# Patient Record
Sex: Female | Born: 1935 | Race: White | Hispanic: No | Smoking: Never smoker
Health system: Southern US, Community
[De-identification: ages and names within clinical notes are randomized; demographics above are authoritative.]

## PROBLEM LIST (undated history)

## (undated) DIAGNOSIS — C801 Malignant (primary) neoplasm, unspecified: Secondary | ICD-10-CM

---

## 2004-11-14 ENCOUNTER — Ambulatory Visit: Payer: Self-pay | Admitting: Internal Medicine

## 2005-06-02 ENCOUNTER — Ambulatory Visit: Payer: Self-pay | Admitting: Unknown Physician Specialty

## 2018-11-26 ENCOUNTER — Other Ambulatory Visit: Payer: Self-pay | Admitting: Internal Medicine

## 2018-11-26 DIAGNOSIS — Z1231 Encounter for screening mammogram for malignant neoplasm of breast: Secondary | ICD-10-CM

## 2018-12-10 ENCOUNTER — Other Ambulatory Visit: Payer: Self-pay | Admitting: *Deleted

## 2018-12-10 ENCOUNTER — Inpatient Hospital Stay
Admission: RE | Admit: 2018-12-10 | Discharge: 2018-12-10 | Disposition: A | Discharge status: Home / Self Care | Payer: Self-pay | Source: Ambulatory Visit | Attending: *Deleted | Admitting: *Deleted

## 2018-12-10 DIAGNOSIS — Z9289 Personal history of other medical treatment: Secondary | ICD-10-CM

## 2019-01-27 ENCOUNTER — Ambulatory Visit
Admission: RE | Admit: 2019-01-27 | Discharge: 2019-01-27 | Disposition: A | Discharge status: Home / Self Care | Payer: Medicare Other | Source: Ambulatory Visit | Attending: Internal Medicine | Admitting: Internal Medicine

## 2019-01-27 ENCOUNTER — Encounter (HOSPITAL_COMMUNITY): Payer: Self-pay

## 2019-01-27 DIAGNOSIS — Z1231 Encounter for screening mammogram for malignant neoplasm of breast: Secondary | ICD-10-CM | POA: Diagnosis present

## 2019-01-27 HISTORY — DX: Malignant (primary) neoplasm, unspecified: C80.1

## 2019-05-15 DIAGNOSIS — C44329 Squamous cell carcinoma of skin of other parts of face: Secondary | ICD-10-CM

## 2019-05-15 DIAGNOSIS — D099 Carcinoma in situ, unspecified: Secondary | ICD-10-CM

## 2019-05-15 HISTORY — DX: Squamous cell carcinoma of skin of other parts of face: C44.329

## 2019-05-15 HISTORY — DX: Carcinoma in situ, unspecified: D09.9

## 2019-10-20 IMAGING — MG DIGITAL SCREENING BILATERAL MAMMOGRAM WITH TOMO AND CAD
8 series · 9 of 24 positions shown · non-contrast
Comparison: Previous exam(s).

CLINICAL DATA: Screening.

EXAM:
DIGITAL SCREENING BILATERAL MAMMOGRAM WITH TOMO AND CAD

[L MLO synth-2D]
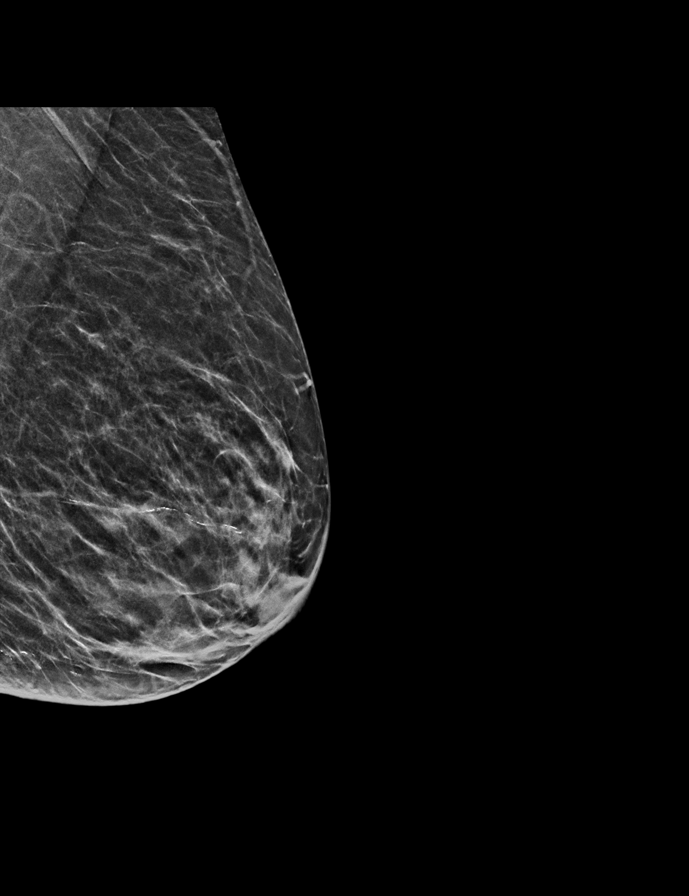

[R MLO synth-2D]
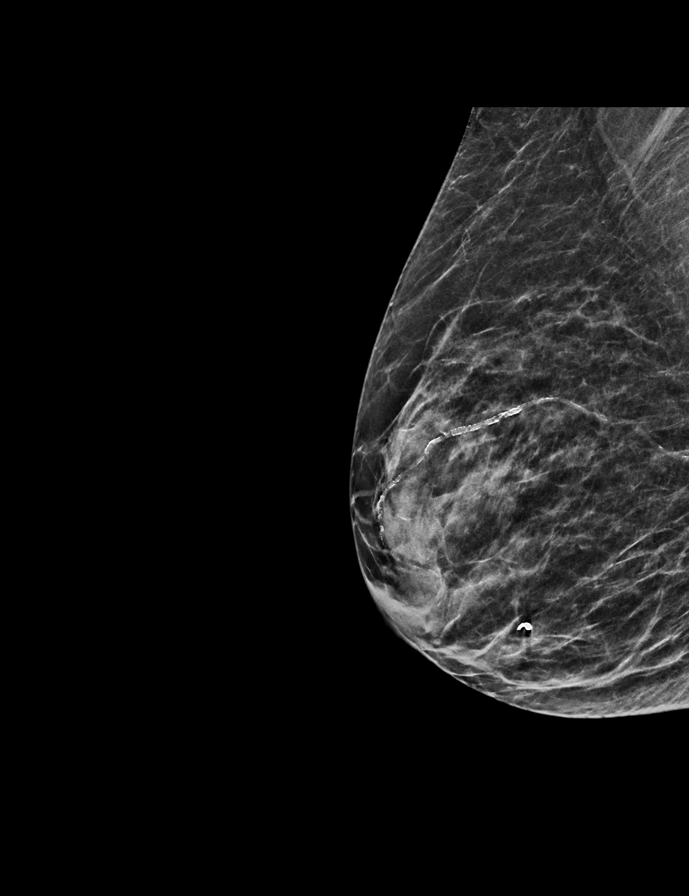

[L CC synth-2D]
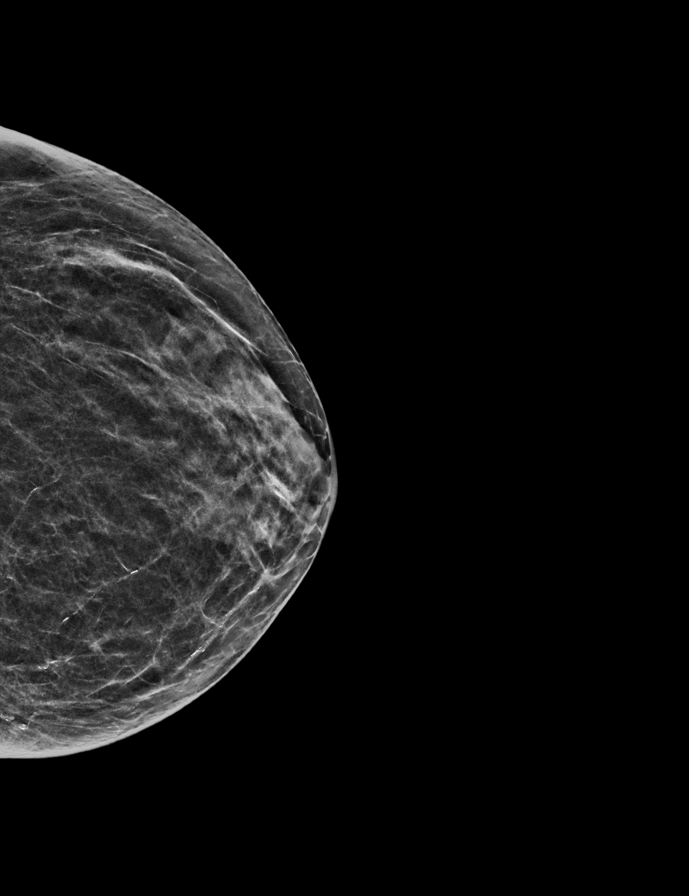

[R CC synth-2D]
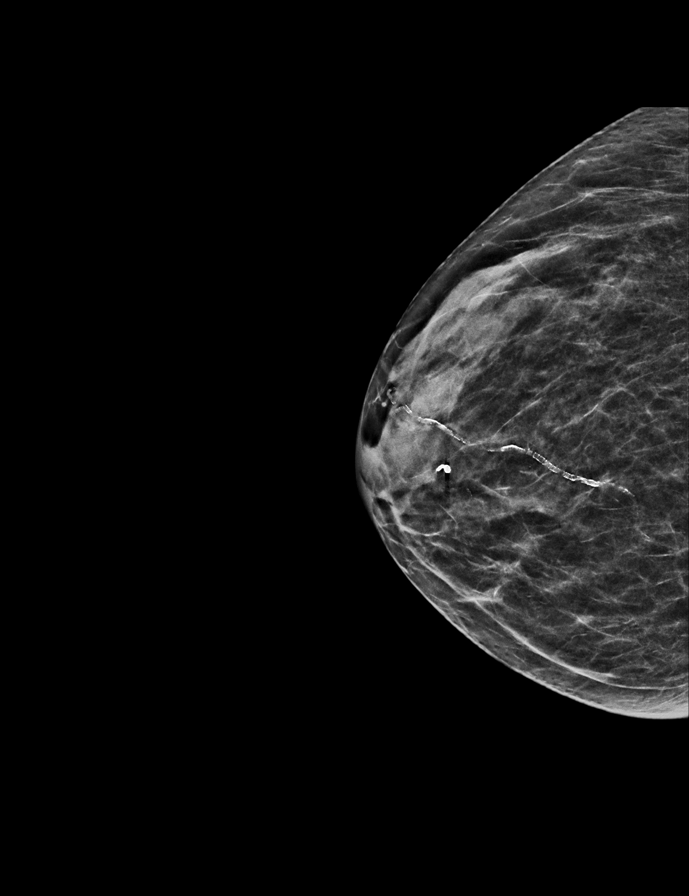

[L MLO tomo · 2 of 46 frames shown]
[frame 15/46]
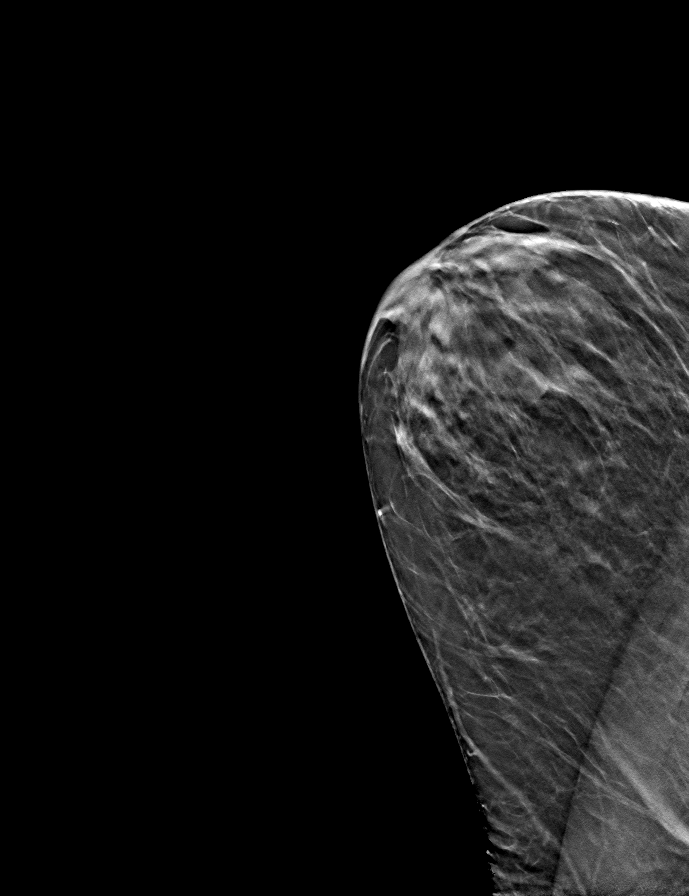
[frame 23/46]
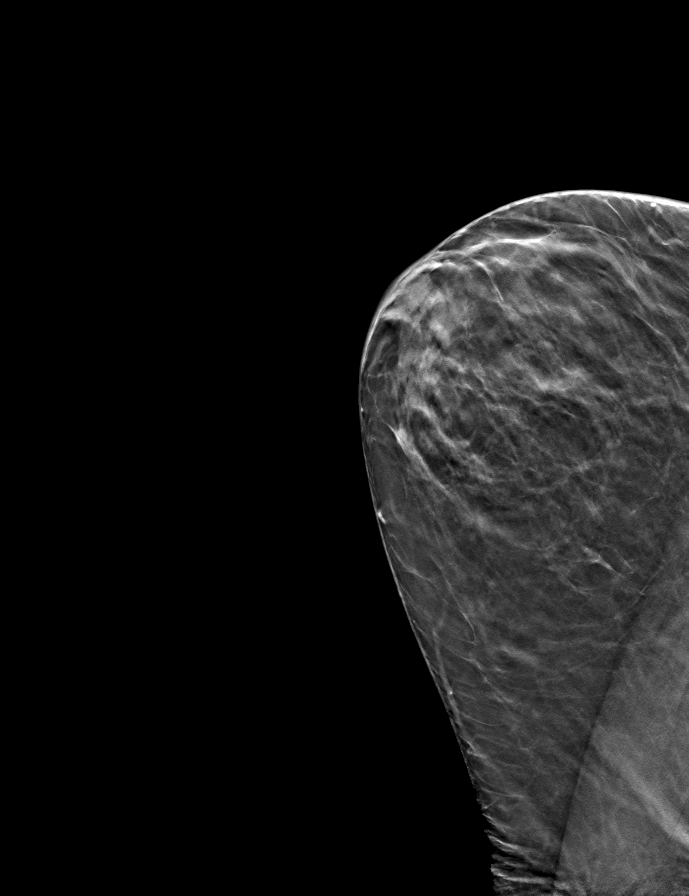

[L CC tomo · tomo slice 24/47.0]
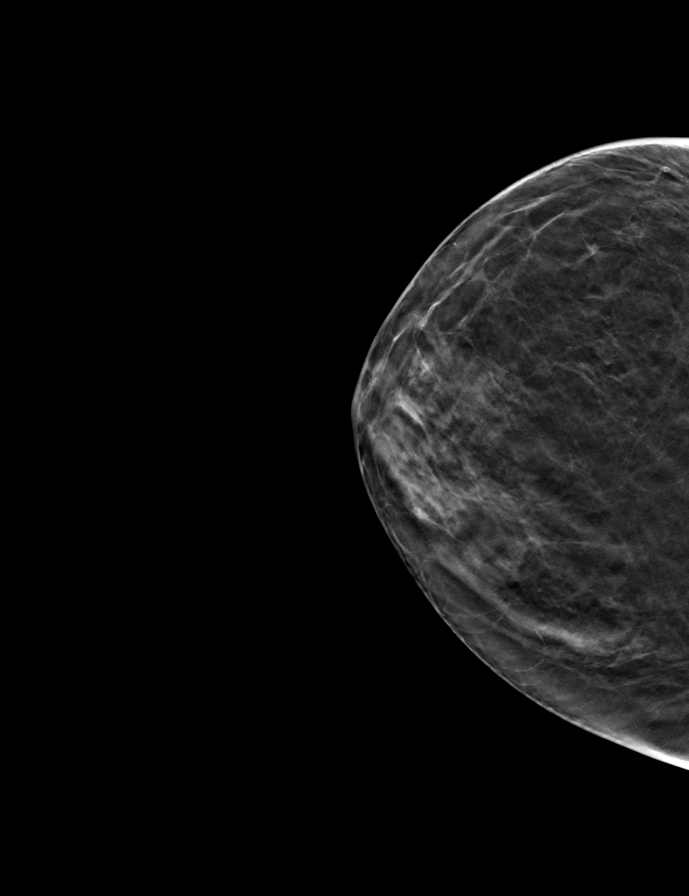

[R CC tomo · tomo slice 23/45.0]
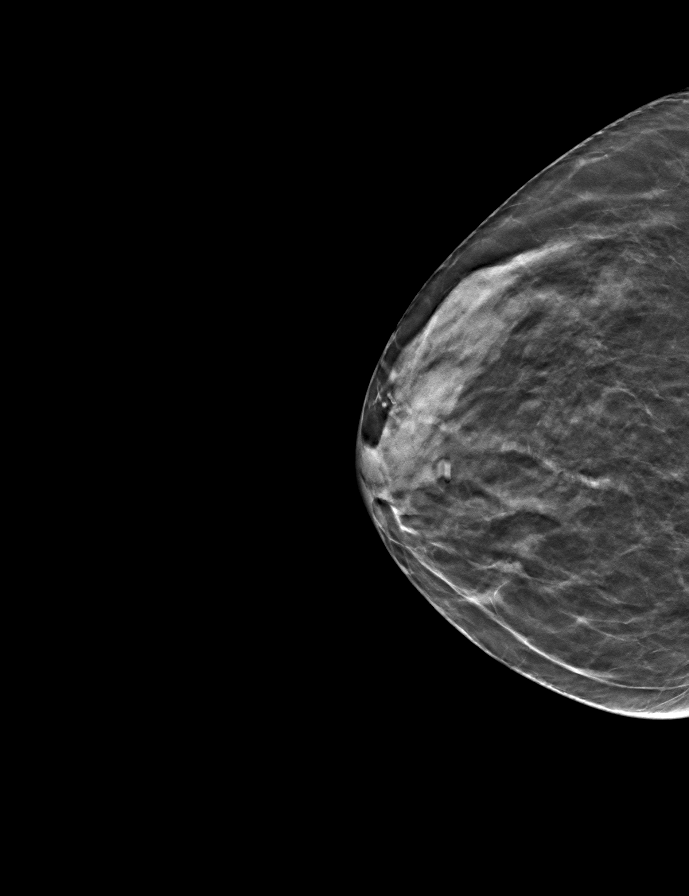

[R MLO tomo · tomo slice 23/46.0]
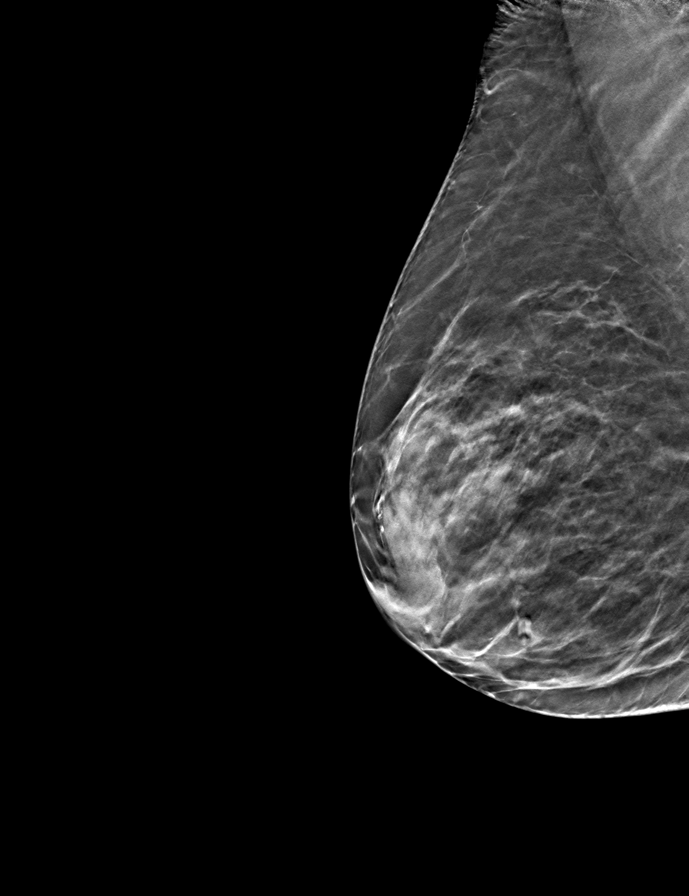

[9 of 24 positions shown; findings below may reference images not displayed]

ACR Breast Density Category c: The breast tissue is heterogeneously
dense, which may obscure small masses.
FINDINGS: There are no findings suspicious for malignancy. Images were
processed with CAD.
IMPRESSION: No mammographic evidence of malignancy. A result letter of this
screening mammogram will be mailed directly to the patient.

RECOMMENDATION:
Screening mammogram in one year. (Code:FT-U-LHB)

BI-RADS CATEGORY  1: Negative.

## 2019-11-13 DIAGNOSIS — C44319 Basal cell carcinoma of skin of other parts of face: Secondary | ICD-10-CM

## 2019-11-13 HISTORY — DX: Basal cell carcinoma of skin of other parts of face: C44.319

## 2020-01-05 ENCOUNTER — Ambulatory Visit: Payer: Medicare Other

## 2020-02-11 ENCOUNTER — Encounter: Payer: Self-pay | Admitting: Dermatology

## 2020-02-11 ENCOUNTER — Ambulatory Visit (INDEPENDENT_AMBULATORY_CARE_PROVIDER_SITE_OTHER): Payer: Medicare Other | Admitting: Dermatology

## 2020-02-11 ENCOUNTER — Other Ambulatory Visit: Payer: Self-pay

## 2020-02-11 DIAGNOSIS — Z872 Personal history of diseases of the skin and subcutaneous tissue: Secondary | ICD-10-CM

## 2020-02-11 DIAGNOSIS — D1801 Hemangioma of skin and subcutaneous tissue: Secondary | ICD-10-CM | POA: Diagnosis not present

## 2020-02-11 DIAGNOSIS — Z85828 Personal history of other malignant neoplasm of skin: Secondary | ICD-10-CM

## 2020-02-11 DIAGNOSIS — L739 Follicular disorder, unspecified: Secondary | ICD-10-CM

## 2020-02-11 DIAGNOSIS — L570 Actinic keratosis: Secondary | ICD-10-CM

## 2020-02-11 DIAGNOSIS — L821 Other seborrheic keratosis: Secondary | ICD-10-CM

## 2020-02-11 NOTE — Progress Notes (Signed)
   Follow-Up Visit   Subjective  Latoya Romero is a 86 y.o. female who presents for the following: Follow-up (HX BCC R forehead abouve eyebrow) and Rough Spot (Left forehead and left medial calf present for a few months). No treatment to rough spots and they are asymptomatic.   BCC above forehead previously treated with ED&C, pt doing well. Notes it healed well without problems. No pain or pruritus at scar.     The following portions of the chart were reviewed this encounter and updated as appropriate: Meds  Problems      Review of Systems: No other skin or systemic complaints.  Objective  Well appearing patient in no apparent distress; mood and affect are within normal limits.  A focused examination was performed including Face, neck, chest and left calf. Relevant physical exam findings are noted in the Assessment and Plan.  Objective  Left Forehead, left lat. eyebrow (2): Erythematous thin papules/macules with gritty scale.   Objective  left medial calf: Perifollicular erythematous papule  Objective  Chest: Red papules.   Objective  Bilateral Forehead, mid chest (3): Clear, no evidence of return   Objective  Right forehead above brow: Well healed scar with no evidence of recurrence.   Objective  Chest: Stuck-on, waxy, tan-brown papules and plaques -- Discussed benign etiology and prognosis.   Assessment & Plan  AK (actinic keratosis) (2) Left Forehead, left lat. Eyebrow Counseled risk of white spot with cryotherapy  Destruction of lesion - Left Forehead, left lat. eyebrow Complexity: simple   Destruction method: cryotherapy   Informed consent: discussed and consent obtained   Lesion destroyed using liquid nitrogen: Yes (2)   Outcome: patient tolerated procedure well with no complications    Folliculitis left medial calf  Benign-appearing.  Observation. Call for worsening  Hemangioma of skin Chest  Benign, observe.   History of actinic keratosis  (3) Bilateral Forehead, mid chest  Clear. No evidence of recurrence.   History of basal cell carcinoma (BCC) s/p ED&C. Pt deferred Mohs and preferred ED&C. Reviewed higher risk recurrence.  Right forehead above brow   Clear. No evidence of recurrence.  Call for any changes.  Seborrheic keratosis Chest  Benign, observe.     Return in about 6 months (around 08/13/2020) for TBSE.

## 2020-02-11 NOTE — Patient Instructions (Signed)
Call for any new or changing lesions and continue daily sun protection.

## 2020-07-29 ENCOUNTER — Encounter: Payer: Self-pay | Admitting: Dermatology

## 2020-07-29 ENCOUNTER — Ambulatory Visit (INDEPENDENT_AMBULATORY_CARE_PROVIDER_SITE_OTHER): Payer: Medicare Other | Admitting: Dermatology

## 2020-07-29 ENCOUNTER — Other Ambulatory Visit: Payer: Self-pay

## 2020-07-29 ENCOUNTER — Other Ambulatory Visit: Payer: Self-pay | Admitting: Dermatology

## 2020-07-29 DIAGNOSIS — D1800 Hemangioma unspecified site: Secondary | ICD-10-CM

## 2020-07-29 DIAGNOSIS — L821 Other seborrheic keratosis: Secondary | ICD-10-CM

## 2020-07-29 DIAGNOSIS — D229 Melanocytic nevi, unspecified: Secondary | ICD-10-CM

## 2020-07-29 DIAGNOSIS — Z85828 Personal history of other malignant neoplasm of skin: Secondary | ICD-10-CM

## 2020-07-29 DIAGNOSIS — D0439 Carcinoma in situ of skin of other parts of face: Secondary | ICD-10-CM

## 2020-07-29 DIAGNOSIS — Z1283 Encounter for screening for malignant neoplasm of skin: Secondary | ICD-10-CM

## 2020-07-29 DIAGNOSIS — L570 Actinic keratosis: Secondary | ICD-10-CM

## 2020-07-29 DIAGNOSIS — C4441 Basal cell carcinoma of skin of scalp and neck: Secondary | ICD-10-CM

## 2020-07-29 DIAGNOSIS — L719 Rosacea, unspecified: Secondary | ICD-10-CM

## 2020-07-29 DIAGNOSIS — D489 Neoplasm of uncertain behavior, unspecified: Secondary | ICD-10-CM

## 2020-07-29 DIAGNOSIS — D485 Neoplasm of uncertain behavior of skin: Secondary | ICD-10-CM

## 2020-07-29 DIAGNOSIS — L578 Other skin changes due to chronic exposure to nonionizing radiation: Secondary | ICD-10-CM

## 2020-07-29 DIAGNOSIS — Z86007 Personal history of in-situ neoplasm of skin: Secondary | ICD-10-CM

## 2020-07-29 DIAGNOSIS — L814 Other melanin hyperpigmentation: Secondary | ICD-10-CM

## 2020-07-29 NOTE — Progress Notes (Signed)
Follow-Up Visit   Subjective  Latoya Romero is a 86 y.o. female who presents for the following: full body skin exam and skin cancer screening  Patient presents today for TBSE, has a few areas of concern, Right brown and behind Right ear. Patient has history of BCC R. 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  Objective  Well appearing patient in no apparent distress; mood and affect are within normal limits.  A full examination was performed including scalp, head, eyes, ears, nose, lips, neck, chest, axillae, abdomen, back, buttocks, bilateral upper extremities, bilateral lower extremities, hands, feet, fingers, toes, fingernails, and toenails. All findings within normal limits unless otherwise noted below.  Objective  Right Supra Brow: 0.5 cm pink papule within scar       Objective  Right Occipital Scalp: 0.8 cm scaly pink plaque       Objective  Left Jaw adjacent to scar: 1.1 cm thin scaly pink plaque       Objective  Mid Supratip of Nose: Mid face erythema with telangiectasias +/- scattered inflammatory papules.   Objective  Right Buccal Cheek : Erythematous thin papules/macules with gritty scale.    Assessment & Plan  Neoplasm of uncertain behavior (3) Right Supra Brow  Skin / nail biopsy Type of biopsy: tangential   Informed consent: discussed and consent obtained   Timeout: patient name, date of birth, surgical site, and procedure verified   Procedure prep:  Patient was prepped and draped in usual sterile fashion Prep type:  Isopropyl alcohol Anesthesia: the lesion was anesthetized in a standard fashion   Anesthetic:  1%  lidocaine w/ epinephrine 1-100,000 buffered w/ 8.4% NaHCO3 Instrument used: flexible razor blade   Hemostasis achieved with: aluminum chloride   Outcome: patient tolerated procedure well   Post-procedure details: sterile dressing applied and wound care instructions given   Dressing type: bandage (mupirocin)    Specimen 1 - Surgical pathology Differential Diagnosis: R/O Scar vs Recurrent BCC Check Margins: No 0.5 cm pink papule within scar  Right Occipital Scalp  Skin / nail biopsy Type of biopsy: tangential   Informed consent: discussed and consent obtained   Timeout: patient name, date of birth, surgical site, and procedure verified   Procedure prep:  Patient was prepped and draped in usual sterile fashion Prep type:  Isopropyl alcohol Anesthesia: the lesion was anesthetized in a standard fashion   Anesthetic:  1% lidocaine w/ epinephrine 1-100,000 buffered w/ 8.4% NaHCO3 Instrument used: flexible razor blade   Hemostasis achieved with: aluminum chloride   Outcome: patient tolerated procedure well   Post-procedure details: sterile dressing applied and wound care instructions given   Dressing type: bandage (mupirocin)    Specimen 2 - Surgical pathology Differential Diagnosis: R/O Scc Check Margins: No 0.8 cm scaly pink plaque  Left Jaw adjacent to scar  Skin / nail biopsy Type of biopsy: tangential   Informed consent: discussed and consent obtained   Timeout: patient name, date of birth, surgical site, and procedure verified   Procedure prep:  Patient was prepped and draped in usual sterile fashion Prep type:  Isopropyl alcohol Anesthesia:  the lesion was anesthetized in a standard fashion   Anesthetic:  1% lidocaine w/ epinephrine 1-100,000 buffered w/ 8.4% NaHCO3 Instrument used: flexible razor blade   Hemostasis achieved with: aluminum chloride   Outcome: patient tolerated procedure well   Post-procedure details: sterile dressing applied and wound care instructions  given   Dressing type: bandage (mupirocin)    Specimen 3 - Surgical pathology Differential Diagnosis: R/O Recurrent SCCis vs AK Check Margins: No 1.1 cm thin scaly pink plaque  Rosacea Mid Supratip of Nose  Discussed Skin Medicinals  Sample given of Soolantra  AK (actinic keratosis) Right Buccal Cheek   Discussed PDT or topical field treatment that is applied twice daily for 4 days, then rtc on day 5. Reviewed expected reaction.   Patient will schedule PDT for Face after any needed treatments for skin cancer on the face (pending biopsy results)    Lentigines - Scattered tan macules - Discussed due to sun exposure - Benign, observe - Call for any changes  Seborrheic Keratoses - Stuck-on, waxy, tan-brown papules and plaques  - Discussed benign etiology and prognosis. - Observe - Call for any changes  Melanocytic Nevi - Tan-brown and/or pink-flesh-colored symmetric macules and papules - Benign appearing on exam today - Observation - Call clinic for new or changing moles - Recommend daily use of broad spectrum spf 30+ sunscreen to sun-exposed areas.   Hemangiomas - Red papules - Discussed benign nature - Observe - Call for any changes  Actinic Damage - diffuse scaly erythematous macules with underlying dyspigmentation - Recommend daily broad spectrum sunscreen SPF 30+ to sun-exposed areas, reapply every 2 hours as needed.  - Call for new or changing lesions.  Skin cancer screening performed today.  History of Basal Cell Carcinoma of the Skin                    R. Forehead above brow 11/13/19 with ED&C in clinic - No evidence of recurrence today - Recommend regular full body skin exams - Recommend daily broad spectrum sunscreen SPF 30+ to sun-exposed areas, reapply every 2 hours as needed.  - Call if any new or changing lesions are noted between office visits  History of Squamous Cell Carcinoma in Situ of the Skin    L. Jaw 05/15/19 with ED&C in Clinic - No  evidence of recurrence today - Recommend regular full body skin exams - Recommend daily broad spectrum sunscreen SPF 30+ to sun-exposed areas, reapply every 2 hours as needed.  - Call if any new or changing lesions are noted between office visits  Return in about 1 month (around 08/28/2020) for ; FBSE in 6 months.  IDonzetta Kohut, CMA, am acting as scribe for Forest Gleason, MD .  Documentation: I have reviewed the above documentation for accuracy and completeness, and I agree with the above.  Forest Gleason, MD

## 2020-07-29 NOTE — Patient Instructions (Addendum)

## 2020-08-05 ENCOUNTER — Other Ambulatory Visit: Payer: Self-pay

## 2020-08-05 ENCOUNTER — Encounter: Payer: Self-pay | Admitting: Dermatology

## 2020-08-05 DIAGNOSIS — C4441 Basal cell carcinoma of skin of scalp and neck: Secondary | ICD-10-CM

## 2020-08-05 DIAGNOSIS — C4492 Squamous cell carcinoma of skin, unspecified: Secondary | ICD-10-CM

## 2020-08-05 NOTE — Progress Notes (Signed)
1. Skin , right supra brow DERMAL SCAR, INFLAMED  2. Skin , right occipital scalp BASAL CELL CARCINOMA, NODULAR AND INFILTRATIVE PATTERNS --> Mohs surgery  3. Skin , left jaw adjacent to scar SQUAMOUS CELL CARCINOMA IN SITU --> Mohs surgery  Dr. Jerilynn Mages reviewed results with patient and daughter on 08/05/2020.    Please refer for Mohs surgery at [facility] in [place]. Ideally we would like Latoya Romero to have a COVID booster before the surgeries if it gets recommended in the [place]. Will plan to schedule Mohs for 6-8 weeks from now to see if we can get her boosted before then.   MAs please send referral.

## 2020-08-09 NOTE — Addendum Note (Signed)
Addended by: Johnsie Kindred R on: 08/09/2020 05:19 PM   Modules accepted: Orders

## 2020-08-11 ENCOUNTER — Telehealth: Payer: Self-pay

## 2020-08-11 NOTE — Telephone Encounter (Signed)
-----   Message from Alfonso Patten, MD sent at 08/05/2020  1:06 PM EDT ----- 1. Skin , right supra brow DERMAL SCAR, INFLAMED  2. Skin , right occipital scalp BASAL CELL CARCINOMA, NODULAR AND INFILTRATIVE PATTERNS --> Mohs surgery  3. Skin , left jaw adjacent to scar SQUAMOUS CELL CARCINOMA IN SITU --> Mohs surgery  Dr. Jerilynn Mages reviewed results with patient and daughter on 08/05/2020.    Please refer for Mohs surgery at [facility] in [place]. Ideally we would like Ms. Latoya Romero to have a COVID booster before the surgeries if it gets recommended in the [place]. Will plan to schedule Mohs for 6-8 weeks from now to see if we can get her boosted before then.   MAs please send referral.

## 2020-08-11 NOTE — Telephone Encounter (Signed)
Patient referred to [facility]

## 2020-08-30 ENCOUNTER — Ambulatory Visit: Payer: Medicare Other

## 2020-09-08 DIAGNOSIS — C4441 Basal cell carcinoma of skin of scalp and neck: Secondary | ICD-10-CM

## 2020-09-08 HISTORY — DX: Basal cell carcinoma of skin of scalp and neck: C44.41

## 2020-09-22 DIAGNOSIS — C44320 Squamous cell carcinoma of skin of unspecified parts of face: Secondary | ICD-10-CM

## 2020-09-22 HISTORY — DX: Squamous cell carcinoma of skin of unspecified parts of face: C44.320

## 2020-12-06 ENCOUNTER — Other Ambulatory Visit: Payer: Self-pay

## 2020-12-06 ENCOUNTER — Ambulatory Visit (INDEPENDENT_AMBULATORY_CARE_PROVIDER_SITE_OTHER): Payer: Medicare Other

## 2020-12-06 DIAGNOSIS — L570 Actinic keratosis: Secondary | ICD-10-CM | POA: Diagnosis not present

## 2020-12-06 MED ORDER — AMINOLEVULINIC ACID HCL 20 % EX SOLR
1.0000 "application " | Freq: Once | CUTANEOUS | Status: AC
  Administered 2020-12-06: 354 mg via TOPICAL

## 2020-12-06 NOTE — Progress Notes (Signed)
1. AK (actinic keratosis) face  Photodynamic therapy - face Procedure discussed: discussed risks, benefits, side effects. and alternatives   Prep: site scrubbed/prepped with acetone   Location:  Face Number of lesions:  Multiple Type of treatment:  Blue light Aminolevulinic Acid (see MAR for details): Levulan Number of Levulan sticks used:  1 Incubation time (minutes):  60 Number of minutes under lamp:  16 Number of seconds under lamp:  40 Cooling:  Floor fan Outcome: patient tolerated procedure well with no complications   Post-procedure details: sunscreen applied and aftercare instructions given to patient    Aminolevulinic Acid HCl 20 % SOLR 354 mg - face   

## 2020-12-06 NOTE — Patient Instructions (Signed)

## 2021-02-16 ENCOUNTER — Encounter: Payer: Self-pay | Admitting: Dermatology

## 2021-02-16 ENCOUNTER — Other Ambulatory Visit: Payer: Self-pay

## 2021-02-16 ENCOUNTER — Ambulatory Visit (INDEPENDENT_AMBULATORY_CARE_PROVIDER_SITE_OTHER): Payer: Medicare Other | Admitting: Dermatology

## 2021-02-16 DIAGNOSIS — L590 Erythema ab igne [dermatitis ab igne]: Secondary | ICD-10-CM | POA: Diagnosis not present

## 2021-02-16 DIAGNOSIS — L814 Other melanin hyperpigmentation: Secondary | ICD-10-CM

## 2021-02-16 DIAGNOSIS — Z86007 Personal history of in-situ neoplasm of skin: Secondary | ICD-10-CM

## 2021-02-16 DIAGNOSIS — D485 Neoplasm of uncertain behavior of skin: Secondary | ICD-10-CM

## 2021-02-16 DIAGNOSIS — L821 Other seborrheic keratosis: Secondary | ICD-10-CM

## 2021-02-16 DIAGNOSIS — Z1283 Encounter for screening for malignant neoplasm of skin: Secondary | ICD-10-CM

## 2021-02-16 DIAGNOSIS — L570 Actinic keratosis: Secondary | ICD-10-CM

## 2021-02-16 DIAGNOSIS — D229 Melanocytic nevi, unspecified: Secondary | ICD-10-CM

## 2021-02-16 DIAGNOSIS — L578 Other skin changes due to chronic exposure to nonionizing radiation: Secondary | ICD-10-CM

## 2021-02-16 DIAGNOSIS — Z85828 Personal history of other malignant neoplasm of skin: Secondary | ICD-10-CM | POA: Diagnosis not present

## 2021-02-16 DIAGNOSIS — D1800 Hemangioma unspecified site: Secondary | ICD-10-CM

## 2021-02-16 NOTE — Patient Instructions (Addendum)
Wound Care Instructions  1. Cleanse wound gently with soap and water once a day then pat dry with clean gauze. Apply a thing coat of Petrolatum (petroleum jelly, "Vaseline") over the wound (unless you have an allergy to this). We recommend that you use a new, sterile tube of Vaseline. Do not pick or remove scabs. Do not remove the yellow or white "healing tissue" from the base of the wound.  2. Cover the wound with fresh, clean, nonstick gauze and secure with paper tape. You may use Band-Aids in place of gauze and tape if the would is small enough, but would recommend trimming much of the tape off as there is often too much. Sometimes Band-Aids can irritate the skin.  3. You should call the office for your biopsy report after 1 week if you have not already been contacted.  4. If you experience any problems, such as abnormal amounts of bleeding, swelling, significant bruising, significant pain, or evidence of infection, please call the office immediately.  5. FOR ADULT SURGERY PATIENTS: If you need something for pain relief you may take 1 extra strength Tylenol (acetaminophen) AND 2 Ibuprofen (200mg  each) together every 4 hours as needed for pain. (do not take these if you are allergic to them or if you have a reason you should not take them.) Typically, you may only need pain medication for 1 to 3 days.    Cryotherapy Aftercare  . Wash gently with soap and water everyday.   Marland Kitchen Apply Vaseline and Band-Aid daily until healed.

## 2021-02-16 NOTE — Progress Notes (Signed)
Follow-Up Visit   Subjective  Latoya Romero is a 87 y.o. female who presents for the following: Total Body Exam (6 month TBSE. Pt did a PDT treatment to face on 12/06/20. States that she did not notice much of a difference from the treatment. Hx of multiple BCC and multiple SCCIS. Pt states that recent [facility] surgeries when well and have been healing nicely. She reports nothing new or changing that she has noticed. ).  The following portions of the chart were reviewed this encounter and updated as appropriate:  Tobacco  Allergies  Meds  Problems  Med Hx  Surg Hx  Fam Hx      Review of Systems: No other skin or systemic complaints except as noted in HPI or Assessment and Plan.   Objective  Well appearing patient in no apparent distress; mood and affect are within normal limits.  A full examination was performed including scalp, head, eyes, ears, nose, lips, neck, chest, axillae, abdomen, back, buttocks, bilateral upper extremities, bilateral lower extremities, hands, feet, fingers, toes, fingernails, and toenails. All findings within normal limits unless otherwise noted below.  Objective  Left Hand x 1, left forearm x 1, right forearm x 1, right dorsal hand x 1 (4): Erythematous thin papules/macules with gritty scale.   Objective  Right Abdomen (side) - Lower: 1.1 cm thin brown plaque        Objective  Right Lower Back: Mottled violaceous and erythematous patches with netlike pattern  Assessment & Plan  AK (actinic keratosis) (4) Left Hand x 1, left forearm x 1, right forearm x 1, right dorsal hand x 1  Prior to procedure, discussed risks of blister formation, small wound, skin dyspigmentation, or rare scar following cryotherapy.    Destruction of lesion - Left Hand x 1, left forearm x 1, right forearm x 1, right dorsal hand x 1  Destruction method: cryotherapy   Informed consent: discussed and consent obtained   Lesion destroyed using liquid nitrogen: Yes    Outcome: patient tolerated procedure well with no complications   Post-procedure details: wound care instructions given    Neoplasm of uncertain behavior of skin Right Abdomen (side) - Lower  Skin / nail biopsy Type of biopsy: tangential   Informed consent: discussed and consent obtained   Timeout: patient name, date of birth, surgical site, and procedure verified   Procedure prep:  Patient was prepped and draped in usual sterile fashion Prep type:  Isopropyl alcohol Anesthesia: the lesion was anesthetized in a standard fashion   Anesthetic:  1% lidocaine w/ epinephrine 1-100,000 buffered w/ 8.4% NaHCO3 Instrument used: flexible razor blade   Hemostasis achieved with: pressure, aluminum chloride and electrodesiccation   Outcome: patient tolerated procedure well   Post-procedure details: sterile dressing applied and wound care instructions given   Dressing type: bandage and petrolatum    Specimen 1 - Surgical pathology Differential Diagnosis: r/o SK vs atypia  Check Margins: Yes 1.1 cm thin brown plaque  Erythema ab igne Right Lower Back  Recommend avoid heating pad/turning down the heat on heating pad.    Lentigines - Scattered tan macules - Due to sun exposure - Benign-appering, observe - Recommend daily broad spectrum sunscreen SPF 30+ to sun-exposed areas, reapply every 2 hours as needed. - Call for any changes  Seborrheic Keratoses - Stuck-on, waxy, tan-brown papules and plaques  - Discussed benign etiology and prognosis. - Observe - Call for any changes  Melanocytic Nevi - Tan-brown and/or pink-flesh-colored symmetric macules and papules -  Benign appearing on exam today - Observation - Call clinic for new or changing moles - Recommend daily use of broad spectrum spf 30+ sunscreen to sun-exposed areas.   Hemangiomas - Red papules - Discussed benign nature - Observe - Call for any changes  Actinic Damage - Chronic, secondary to cumulative UV/sun  exposure - diffuse scaly erythematous macules with underlying dyspigmentation - Recommend daily broad spectrum sunscreen SPF 30+ to sun-exposed areas, reapply every 2 hours as needed.  - Call for new or changing lesions.  Skin cancer screening performed today.  History of Basal Cell Carcinoma of the Skin R forehead above brow(11/13/19 tx'd with EDC), R occipital scalp (07/29/20 - tx'd with MOHs) - No evidence of recurrence today - Recommend regular full body skin exams - Recommend daily broad spectrum sunscreen SPF 30+ to sun-exposed areas, reapply every 2 hours as needed.  - Call if any new or changing lesions are noted between office visits  History of Squamous Cell Carcinoma in Situ of the Skin L jaw(05/15/19 - tx'd with EDC), L jaw adjacent to scar (07/29/20 - tx'd with MOHs) - No evidence of recurrence today - Recommend regular full body skin exams - Recommend daily broad spectrum sunscreen SPF 30+ to sun-exposed areas, reapply every 2 hours as needed.  - Call if any new or changing lesions are noted between office visits   Return in about 6 months (around 08/19/2021) for TBSE.   I, Harriett Sine, CMA, am acting as scribe for Forest Gleason, MD.  Documentation: I have reviewed the above documentation for accuracy and completeness, and I agree with the above.  Forest Gleason, MD

## 2021-08-03 ENCOUNTER — Ambulatory Visit: Payer: Medicare Other | Admitting: Dermatology

## 2021-08-09 ENCOUNTER — Telehealth: Payer: Self-pay

## 2021-08-09 ENCOUNTER — Ambulatory Visit: Payer: Medicare Other | Admitting: Dermatology

## 2021-08-09 NOTE — Telephone Encounter (Signed)
Patient came in today for appointment at 8:30 with Dr. Laurence Ferrari but appointment was cancelled. There are notes on appointment that patient left voicemail that she will call back and reschedule appointment. Patient had a friend with her as well that seemed to be giving Jenny Reichmann a hard time. When I approached patient to let her know that since this appointment was cancelled it was filled and Dr. Laurence Ferrari does not have any additional openings, patient and her friend stated lets just go. I tried offering the patient to come back tomorrow at 9:30 for opening but they ignored me and continued to walk out of the office. Abby received voicemail from the week and documented why the appointment was cancelled.

## 2022-10-30 ENCOUNTER — Telehealth: Payer: Self-pay

## 2022-10-30 NOTE — Telephone Encounter (Signed)
Encounter notes from [facility] scanned in under the media tab for your review.

## 2023-05-04 NOTE — Telephone Encounter (Signed)
Reviewed. MAs please confirm that patient is scheduled for FBSE and mark off in the biopsy book. Thank you!
